# Patient Record
Sex: Female | Born: 1990 | Race: White | Hispanic: No | Smoking: Never smoker
Health system: Southern US, Community
[De-identification: ages and names within clinical notes are randomized; demographics above are authoritative.]

## PROBLEM LIST (undated history)

## (undated) DIAGNOSIS — G35D Multiple sclerosis, unspecified: Secondary | ICD-10-CM

## (undated) HISTORY — PX: WISDOM TOOTH EXTRACTION: SHX21

---

## 2020-08-19 ENCOUNTER — Other Ambulatory Visit (HOSPITAL_COMMUNITY)
Admission: RE | Admit: 2020-08-19 | Discharge: 2020-08-19 | Disposition: A | Discharge status: Home / Self Care | Payer: No Typology Code available for payment source | Source: Ambulatory Visit | Attending: Obstetrics | Admitting: Obstetrics

## 2020-08-19 ENCOUNTER — Ambulatory Visit (INDEPENDENT_AMBULATORY_CARE_PROVIDER_SITE_OTHER): Payer: No Typology Code available for payment source | Admitting: Obstetrics

## 2020-08-19 ENCOUNTER — Encounter: Payer: Self-pay | Admitting: Obstetrics

## 2020-08-19 ENCOUNTER — Other Ambulatory Visit: Payer: Self-pay

## 2020-08-19 VITALS — BP 120/72 | HR 83 | Ht 69.0 in | Wt 214.0 lb

## 2020-08-19 DIAGNOSIS — Z01419 Encounter for gynecological examination (general) (routine) without abnormal findings: Secondary | ICD-10-CM

## 2020-08-19 DIAGNOSIS — Z1151 Encounter for screening for human papillomavirus (HPV): Secondary | ICD-10-CM | POA: Insufficient documentation

## 2020-08-19 DIAGNOSIS — Z113 Encounter for screening for infections with a predominantly sexual mode of transmission: Secondary | ICD-10-CM | POA: Insufficient documentation

## 2020-08-19 DIAGNOSIS — Z23 Encounter for immunization: Secondary | ICD-10-CM | POA: Diagnosis not present

## 2020-08-19 NOTE — Progress Notes (Signed)
Gynecology Annual Exam  PCP: Pcp, No  Chief Complaint:  Chief Complaint  Patient presents with  . Gynecologic Exam    still spotting after period last week; is on bc - depo    History of Present Illness:  Ms. Jacqueline Guerrero is a 29 y.o. G0P0000 who LMP was Patient's last menstrual period was 08/11/2020 (exact date)., presents today for her annual examination.  Her menses are irregular, lasting 3 day(s).  Dysmenorrhea none. She reports irregualr bleeding with Depo have intermenstrual bleeding.  She is single partner, contraception - Depo-Provera injections.  Last Pap: cannot remember- several years ago in [place]  Results were: no abnormalities /neg HPV DNA neg  Hx of STDs: none  There is a FH of breast cancer(her mother at age 60) There is no FH of ovarian cancer. The patient does do self-breast exams.  Tobacco use: She uses smokeless tobacco. Alcohol use: social drinker Exercise: not active    The patient wears seatbelts: yes.   The patient reports that domestic violence in her life is absent.   No past medical history on file.    Prior to Admission medications   Medication Sig Start Date End Date Taking? Authorizing Provider  ELLA 30 MG tablet Take 1 tablet by mouth once. 07/14/20  Yes [provider]  medroxyPROGESTERone (DEPO-SUBQ PROVERA 104) 104 MG/0.65ML injection Inject 104 mg into the skin See admin instructions. Inject every 12 weeks.   Yes [provider]    Allergies  Allergen Reactions  . Penicillins     Gynecologic History: Patient's last menstrual period was 08/11/2020 (exact date). History of abnormal pap smear: No History of STI: No   Obstetric History: G0P0000  Social History   Socioeconomic History  . Marital status: Single    Spouse name: Not on file  . Number of children: Not on file  . Years of education: Not on file  . Highest education level: Not on file  Occupational History  . Not on file  Tobacco Use  . Smoking  status: Never Smoker  . Smokeless tobacco: Never Used  Vaping Use  . Vaping Use: Every day  Substance and Sexual Activity  . Alcohol use: Yes    Comment: 2 times a week  . Drug use: Not Currently  . Sexual activity: Yes    Birth control/protection: Injection  Other Topics Concern  . Not on file  Social History Narrative  . Not on file   Social Determinants of Health   Financial Resource Strain:   . Difficulty of Paying Living Expenses: Not on file  Food Insecurity:   . Worried About Programme researcher, broadcasting/film/video in the Last Year: Not on file  . Ran Out of Food in the Last Year: Not on file  Transportation Needs:   . Lack of Transportation (Medical): Not on file  . Lack of Transportation (Non-Medical): Not on file  Physical Activity:   . Days of Exercise per Week: Not on file  . Minutes of Exercise per Session: Not on file  Stress:   . Feeling of Stress : Not on file  Social Connections:   . Frequency of Communication with Friends and Family: Not on file  . Frequency of Social Gatherings with Friends and Family: Not on file  . Attends Religious Services: Not on file  . Active Member of Clubs or Organizations: Not on file  . Attends Banker Meetings: Not on file  . Marital Status: Not on file  Intimate Partner  Violence:   . Fear of Current or Ex-Partner: Not on file  . Emotionally Abused: Not on file  . Physically Abused: Not on file  . Sexually Abused: Not on file    Family History  Problem Relation Age of Onset  . Breast cancer Mother 47  . Cancer Mother 40       brain, bone between 62 and 65  . Cancer Father 50       skin    Review of Systems  Constitutional: Negative.   HENT: Negative.   Eyes: Negative.   Respiratory:       Smoker-vapes not ready to quit  Cardiovascular: Negative.   Gastrointestinal: Negative.   Genitourinary: Negative.   Skin: Positive for rash.  Neurological: Negative.   Endo/Heme/Allergies: Negative.   Psychiatric/Behavioral:  Negative.        Hx of suicide attempts and inpatient treatment in 2016. No present therapy or medication Denies any depression or anxiety or suicidal ideations.     Physical Exam BP 120/72   Pulse 83   Ht 5\' 9"  (1.753 m)   Wt 214 lb (97.1 kg)   LMP 08/11/2020 (Exact Date)   BMI 31.60 kg/m    Physical Exam Constitutional:      Appearance: Normal appearance. She is obese.  Genitourinary:     Vulva normal.     Genitourinary Comments: Shaves entire mons  HENT:     Head: Normocephalic.     Nose: Nose normal.     Mouth/Throat:     Mouth: Mucous membranes are moist.     Pharynx: Oropharynx is clear.  Eyes:     Pupils: Pupils are equal, round, and reactive to light.  Cardiovascular:     Rate and Rhythm: Normal rate and regular rhythm.     Pulses: Normal pulses.     Heart sounds: Normal heart sounds.  Pulmonary:     Effort: Pulmonary effort is normal.     Breath sounds: Normal breath sounds.  Abdominal:     General: Bowel sounds are normal.     Palpations: Abdomen is soft.  Musculoskeletal:        General: Normal range of motion.     Cervical back: Normal range of motion and neck supple.  Neurological:     General: No focal deficit present.     Mental Status: She is alert and oriented to person, place, and time.  Skin:    General: Skin is warm and dry.  Psychiatric:        Mood and Affect: Mood normal.        Behavior: Behavior normal.        Thought Content: Thought content normal.     Female chaperone present for pelvic and breast  portions of the physical exam  Results:     Assessment: 29 y.o. G0P0000 female here for routine annual gynecologic examination contraception with Depo provera.  Plan: Problem List Items Addressed This Visit    None      Screening: -- Blood pressure screen normal -- Weight screening: obese: discussed management options, including lifestyle, dietary, and exercise. -- Depression screening negative (PHQ-9) -- Nutrition:  normal -- cholesterol screening: not due for screening -- osteoporosis screening: not due -- tobacco screening: using: discussed quitting using the 5 A's -- alcohol screening: AUDIT questionnaire indicates low-risk usage. -- family history of breast cancer screening: done. not at high risk. -- no evidence of domestic violence or intimate partner violence. -- STD screening: gonorrhea/chlamydia NAAT  collected -- pap smear collected per ASCCP guidelines -- flu vaccine received today -- HPV vaccination series: has not received - pt refuses  Contraception education discussed and she is considering an IUD. She will make an appointment for her next Depo or notifiy [place] if she desires to change to an IUD.  Mirna Mires, CNM  08/19/2020 7:01 PM    08/19/2020 1:40 PM

## 2020-08-20 LAB — HEP, RPR, HIV PANEL
HIV Screen 4th Generation wRfx: NONREACTIVE
Hepatitis B Surface Ag: NEGATIVE
RPR Ser Ql: NONREACTIVE

## 2020-08-21 ENCOUNTER — Encounter: Payer: Self-pay | Admitting: Obstetrics

## 2020-08-21 LAB — CYTOLOGY - PAP
Chlamydia: NEGATIVE
Comment: NEGATIVE
Comment: NEGATIVE
Comment: NEGATIVE
Comment: NORMAL
Diagnosis: NEGATIVE
High risk HPV: NEGATIVE
Neisseria Gonorrhea: NEGATIVE
Trichomonas: NEGATIVE

## 2021-05-19 ENCOUNTER — Ambulatory Visit: Payer: Self-pay | Admitting: Podiatry

## 2021-05-19 ENCOUNTER — Other Ambulatory Visit: Payer: Self-pay

## 2021-05-19 ENCOUNTER — Ambulatory Visit: Payer: PRIVATE HEALTH INSURANCE

## 2021-07-02 ENCOUNTER — Other Ambulatory Visit: Payer: Self-pay | Admitting: Podiatry

## 2021-07-02 DIAGNOSIS — S86312A Strain of muscle(s) and tendon(s) of peroneal muscle group at lower leg level, left leg, initial encounter: Secondary | ICD-10-CM

## 2021-07-02 DIAGNOSIS — S8262XA Displaced fracture of lateral malleolus of left fibula, initial encounter for closed fracture: Secondary | ICD-10-CM

## 2021-07-13 ENCOUNTER — Ambulatory Visit
Admission: RE | Admit: 2021-07-13 | Discharge: 2021-07-13 | Disposition: A | Discharge status: Home / Self Care | Payer: PRIVATE HEALTH INSURANCE | Source: Ambulatory Visit | Attending: Podiatry | Admitting: Podiatry

## 2021-07-13 ENCOUNTER — Other Ambulatory Visit: Payer: Self-pay

## 2021-07-13 DIAGNOSIS — S86312A Strain of muscle(s) and tendon(s) of peroneal muscle group at lower leg level, left leg, initial encounter: Secondary | ICD-10-CM | POA: Diagnosis present

## 2021-07-13 DIAGNOSIS — S8262XA Displaced fracture of lateral malleolus of left fibula, initial encounter for closed fracture: Secondary | ICD-10-CM | POA: Insufficient documentation

## 2022-02-04 ENCOUNTER — Ambulatory Visit: Payer: BLUE CROSS/BLUE SHIELD | Admitting: Obstetrics

## 2022-08-17 ENCOUNTER — Encounter: Payer: Self-pay | Admitting: Internal Medicine

## 2022-08-17 DIAGNOSIS — G4719 Other hypersomnia: Secondary | ICD-10-CM

## 2022-08-24 NOTE — Procedures (Signed)
[facility] Report Part I                                                                 Phone: [phone] Fax: [phone]  Patient Name: Jacqueline Guerrero, [name]. Acquisition Number: [id number]  Date of Birth: 08/18/1991 Acquisition Date: 08/17/2022  Referring Physician: Willaim Rayas FNP-C     History: The patient is a 31 year old female who was referred for evaluation of possible sleep apnea with snoring and sleepiness. Medical History: depression, anxiety.  Medications: topiramate, melatonin, magnesium, lactobacillus rhamnosus  Procedure: This routine overnight polysomnogram was performed on the [facility] 5 using the standard diagnostic protocol. This included 6 channels of EEG, 2 channels of EOG, chin EMG, bilateral anterior tibialis EMG, nasal/oral thermistor, PTAF (nasal pressure transducer), chest and abdominal wall movements, EKG, and pulse oximetry.  Description: The total recording time was 417.0 minutes. The total sleep time was 393.5 minutes. There were a total of 6.5 minutes of wakefulness after sleep onset for a goodsleep efficiency of 94.4%. The latency to sleep onset was within normal limitsat 17.0 minutes. The R sleep onset latency was within normal limits at 73.5 minutes. Sleep parameters, as a percentage of the total sleep time, demonstrated 0.9% of sleep was in N1 sleep, 66.5% N2, 17.4% N3 and 15.2% R sleep. There were a total of 32 arousals for an arousal index of 4.9 arousals per hour of sleep that was normal.  Respiratory monitoring demonstrated intermittent mild to moderate degree of snoring in all positions. Only 2 respiratory events were observed during the study. Tthe baseline oxygen saturation during wakefulness was 96%, during NREM sleep averaged 96%, and during REM sleep averaged  96%. The total duration of oxygen < 90% was 0.0 minutes.  Cardiac monitoring- There were no significant cardiac rhythm irregularities.   Periodic limb movement  monitoring- did not demonstrate periodic limb movements.   Impression: This routine overnight polysomnogram did not demonstrate significant obstructive sleep apnea with only 2 respiratory events observed.  Sleep efficiency was good and sleep onset latency was normal. Only a reduced REM percentage was observed.   Recommendations:     Would recommend weight loss in a patient with a BMI of 39.0.    Allyne Gee, MD, [facility] Diplomate ABMS-Pulmonary, Critical Care and Sleep Medicine  Electronically reviewed and digitally signed  [place] Report Part II  Phone: [phone] Fax: [phone]  Patient last name [facility] Neck Size    in. Acquisition [id number]  Patient first name Jacqueline Guerrero. Weight 264.0 lbs. Started 08/17/2022 at 10:51:46 PM  Birth date [birth year] Height 69.0 in. Stopped 08/18/2022 at 5:55:58 AM  Age 68 BMI 39.0 lb/in2 Duration 417.0  Study Type Adult      Juleen Starr - RPSGT, Margaretmary Eddy  Reviewed by: Richelle Ito. Henke, PhD, ABSM, FAASM Sleep Data: Lights Out: 10:56:16 PM Sleep Onset: 11:13:16 PM  Lights On: 5:53:16 AM Sleep Efficiency: 94.4 %  Total Recording Time: 417.0 min Sleep Latency (from Lights Off) 17.0 min  Total Sleep Time (TST): 393.5 min R Latency (from Sleep Onset): 73.5 min  Sleep Period Time: 399.5 min Total number of awakenings: 5  Wake during sleep: 6.0 min Wake After Sleep Onset (WASO):  6.5 min   Sleep Data:         Arousal Summary: Stage  Latency from lights out (min) Latency from sleep onset (min) Duration (min) % Total Sleep Time  Normal values  N 1 17.0 0.0 3.5 0.9 (5%)  N 2 17.5 0.5 261.5 66.5 (50%)  N 3 43.0 26.0 68.5 17.4 (20%)  R 90.5 73.5 60.0 15.2 (25%)    Number Index  Spontaneous 32 4.9  Apneas & Hypopneas 0 0.0  RERAs 0 0.0       (Apneas & Hypopneas & RERAs)  (0) (0.0)  Limb Movement 0 0.0  Snore 0 0.0  TOTAL 32 4.9     Respiratory Data:  CA OA MA Apnea Hypopnea* A+ H RERA Total  Number 0 0 0 0 2  2 0 2  Mean Dur (sec) 0.0 0.0 0.0 0.0 33.0 33.0 0.0 33.0  Max Dur (sec) 0.0 0.0 0.0 0.0 41.0 41.0 0.0 41.0  Total Dur (min) 0.0 0.0 0.0 0.0 1.1 1.1 0.0 1.1  % of TST 0.0 0.0 0.0 0.0 0.3 0.3 0.0 0.3  Index (#/h TST) 0.0 0.0 0.0 0.0 0.3 0.3 0.0 0.3  *Hypopneas scored based on 4% or greater desaturation.  Sleep Stage:        REM NREM TST  AHI 1.0 0.0 0.3  RDI 1.0 0.0 0.3           Body Position Data:  Sleep (min) TST (%) REM (min) NREM (min) CA (#) OA (#) MA (#) HYP (#) AHI (#/h) RERA (#) RDI (#/h) Desat (#)  Supine 238.8 60.69 44.3 194.5 0 0 0 2 0.5 0 0.5 9  Non-Supine 154.70 39.31 15.70 139.00 0.00 0.00 0.00 0.00 0.00 0 0.00 3.00  Left: 154.5 39.26 15.5 139.0 0 0 0 0 0.0 0 0.00 3  Right: 0.2 0.05 0.2 0.0 0 0 0 0 0.0 0 0.00 0     Snoring: Total number of snoring episodes  0  Total time with snoring    min (   % of sleep)   Oximetry Distribution:             WK REM NREM TOTAL  Average (%)   96 96 96 96  < 90% 0.0 0.0 0.0 0.0  < 80% 0.0 0.0 0.0 0.0  < 70% 0.0 0.0 0.0 0.0  # of Desaturations* 1 6 5 12   Desat Index (#/hour) 2.6 6.0 0.9 1.8  Desat Max (%) 4 5 4 5   Desat Max Dur (sec) 37.0 115.0 25.0 115.0  Approx Min O2 during sleep 93  Approx min O2 during a respiratory event 94  Was Oxygen added (Y/N) and final rate No:   0 LPM  *Desaturations based on 3% or greater drop from baseline.   Cheyne Stokes Breathing: None Present   Hypoventilation: None Present    Heart Rate Summary:  Average Heart Rate During Sleep 53.8 bpm      Highest Heart Rate During Sleep (95th %) 65.0 bpm      Highest Heart Rate During Sleep 136 bpm      Highest Heart Rate During Recording (TIB) 162 bpm (artifact)   Heart Rate Observations: Event Type # Events   Bradycardia 0 Lowest HR Scored: N/A  Sinus Tachycardia During Sleep 0 Highest HR Scored: N/A  Narrow Complex Tachycardia 0 Highest HR Scored: N/A  Wide Complex Tachycardia 0 Highest HR Scored: N/A  Asystole 0 Longest  Pause: N/A  Atrial Fibrillation 0 Duration Longest  Event: N/A  Other Arrythmias  No Type:    Periodic Limb Movement Data: (Primary legs unless otherwise noted) Total # Limb Movement 0 Limb Movement Index 0.0  Total # PLMS    PLMS Index     Total # PLMS Arousals    PLMS Arousal Index     Percentage Sleep Time with PLMS   min (   % sleep)  Mean Duration limb movements (secs)

## 2023-03-02 IMAGING — MR MR ANKLE*L* W/O CM
5 series · 40 of 40 positions shown · non-contrast
Comparison: Joint fluid collecting in the posterior recess of the
posterior subtalar joint.

CLINICAL DATA: Work injury, left ankle pain

EXAM:
MRI OF THE LEFT ANKLE WITHOUT CONTRAST
TECHNIQUE: Multiplanar, multisequence MR imaging of the ankle was performed. No
intravenous contrast was administered.

[Series 3: PD fat-sat · axial · left · 3.0mm · 0.50mm/px · z∈[-61,+79]mm · 10 of 36 slices shown]
[im 1/36]
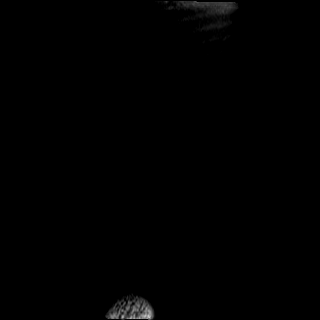
[im 4/36]
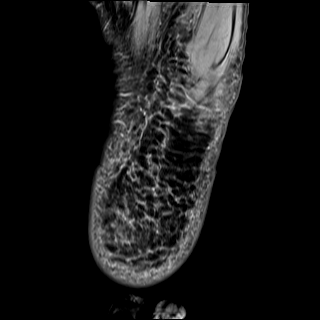
[im 8/36]
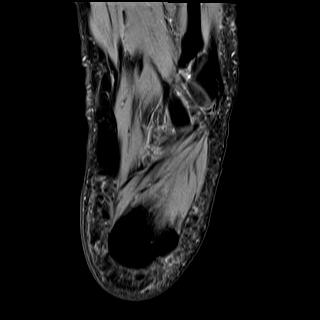
[im 12/36]
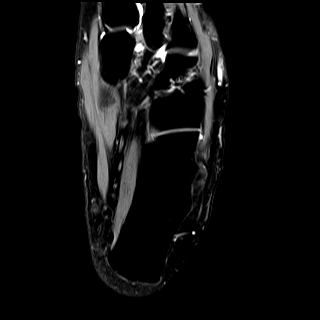
[im 16/36]
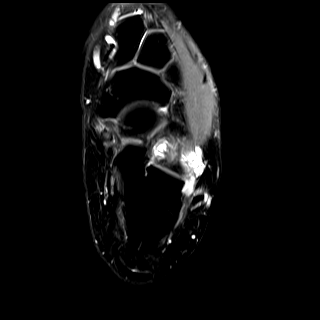
[im 20/36]
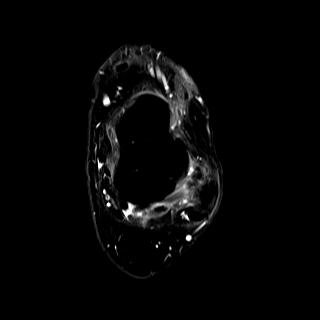
[im 24/36]
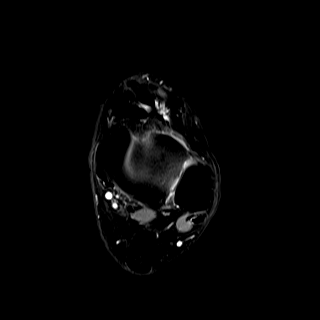
[im 28/36]
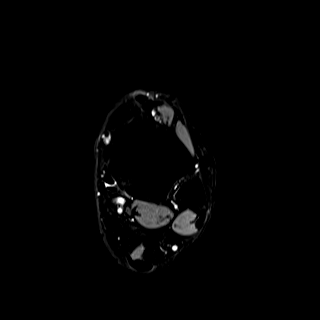
[im 32/36]
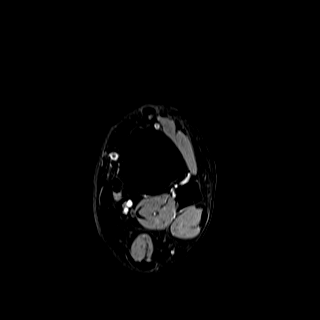
[im 36/36]
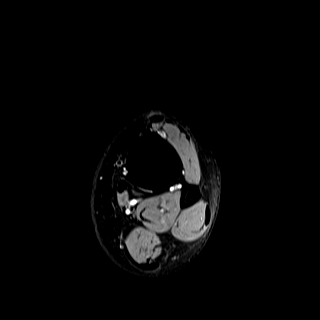

[Series 4: T2 fat-sat · axial · left · 3.0mm · 0.50mm/px · z∈[-61,+79]mm · 10 of 36 slices shown]
[im 1/36]
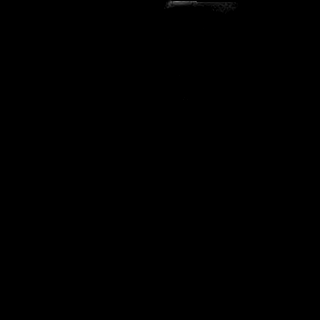
[im 4/36]
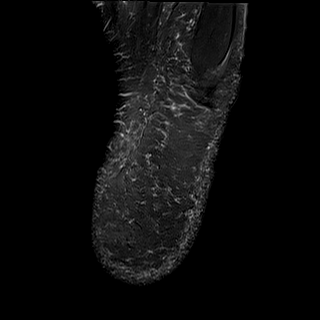
[im 8/36]
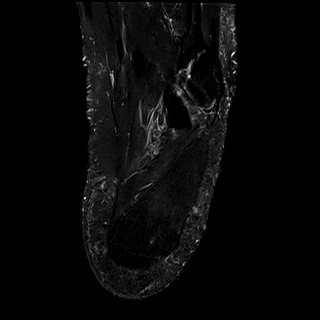
[im 12/36]
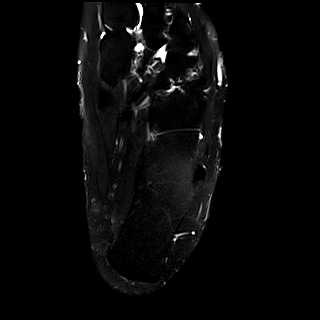
[im 16/36]
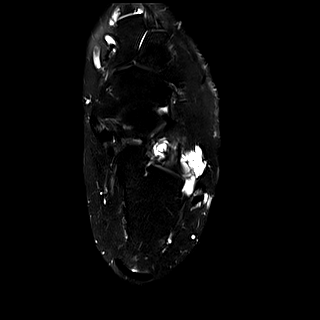
[im 20/36]
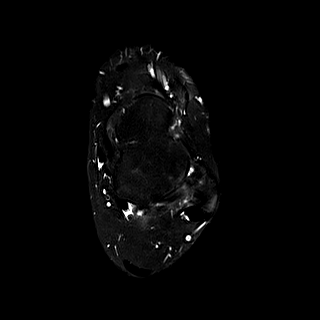
[im 24/36]
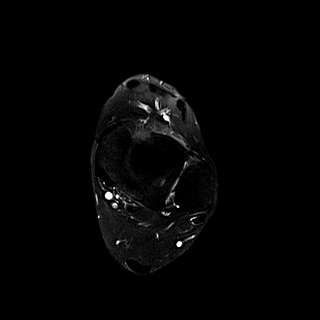
[im 28/36]
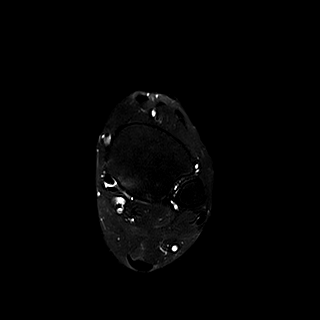
[im 32/36]
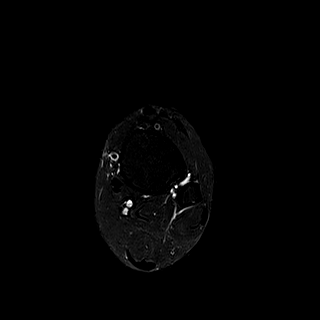
[im 36/36]
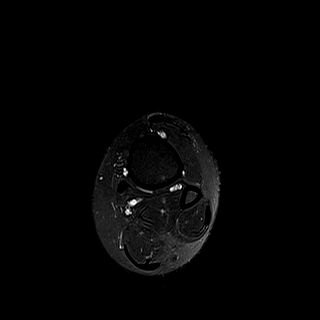

[Series 6: T2 · coronal · left · 3.0mm · 0.62mm/px · 10 of 40 slices shown]
[im 1/40]
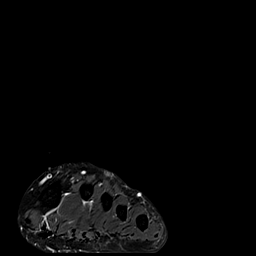
[im 5/40]
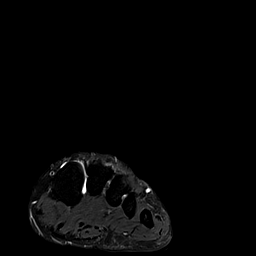
[im 9/40]
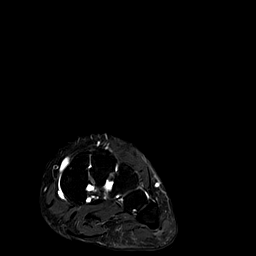
[im 14/40]
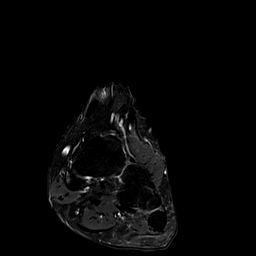
[im 18/40]
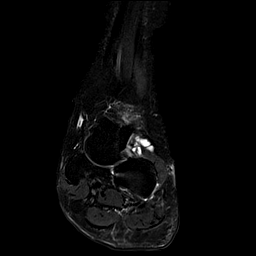
[im 22/40]
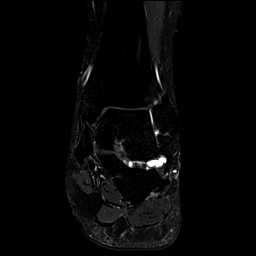
[im 27/40]
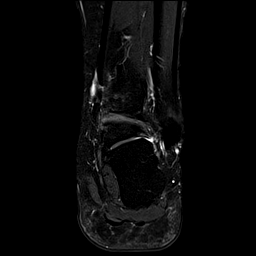
[im 31/40]
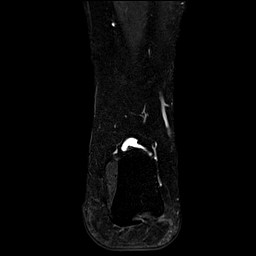
[im 35/40]
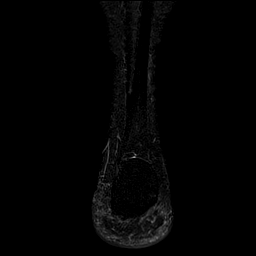
[im 40/40]
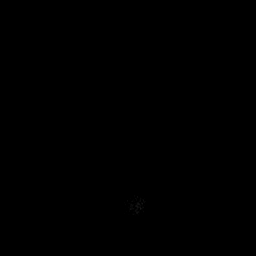

[Series 7: T1 · sagittal · left · 4.0mm · 0.70mm/px · 5 of 21 slices shown]
[im 1/21]
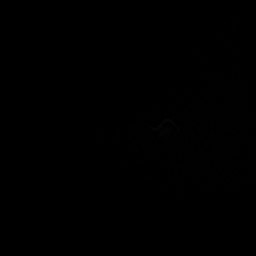
[im 6/21]
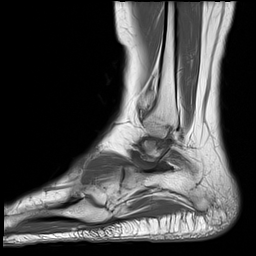
[im 11/21]
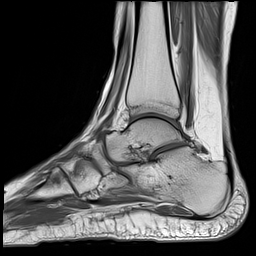
[im 16/21]
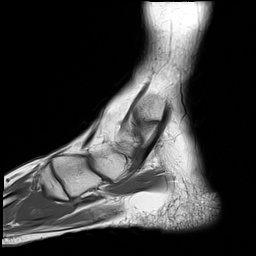
[im 21/21]
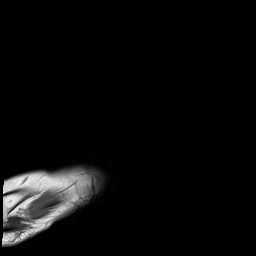

[Series 8: STIR · sagittal · left · 4.0mm · 0.35mm/px · 5 of 21 slices shown]
[im 1/21]
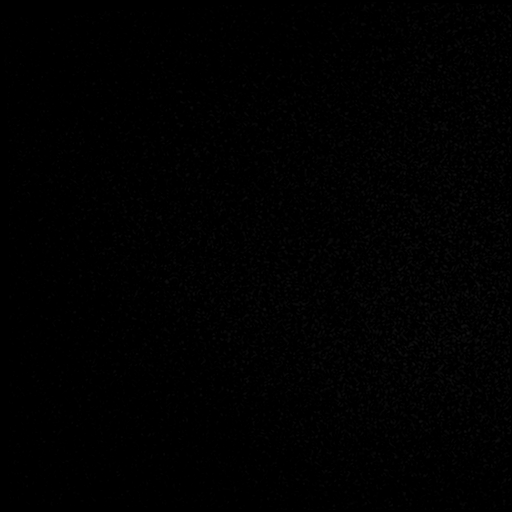
[im 6/21]
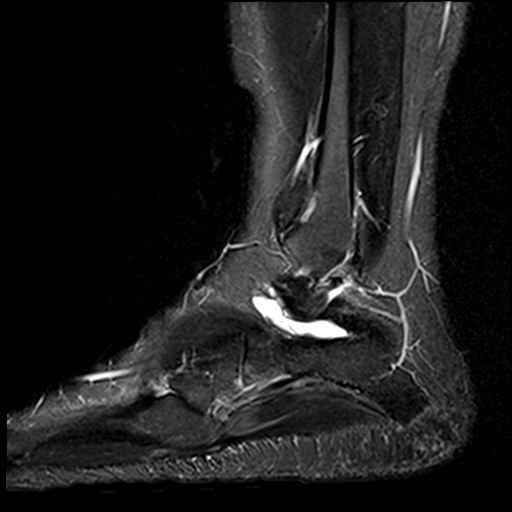
[im 11/21]
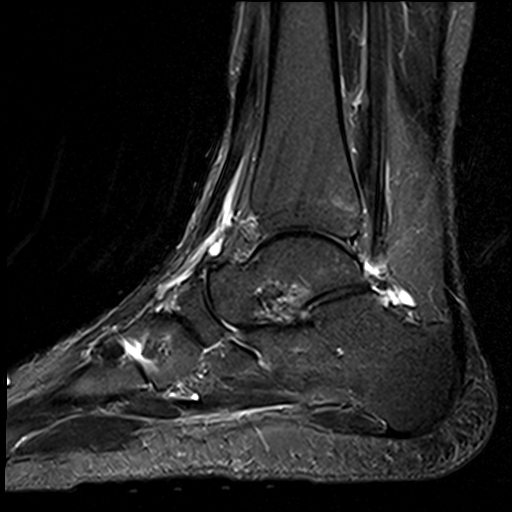
[im 16/21]
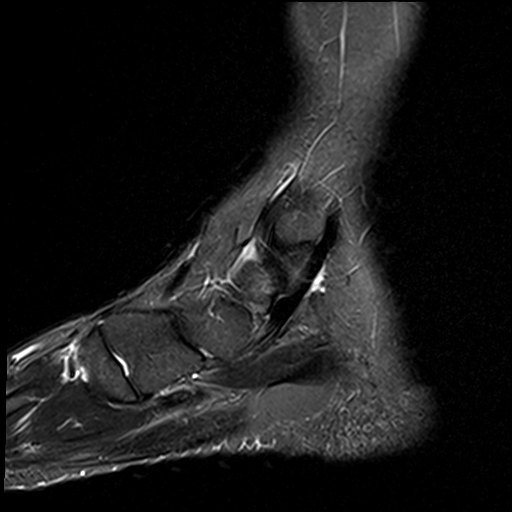
[im 21/21]
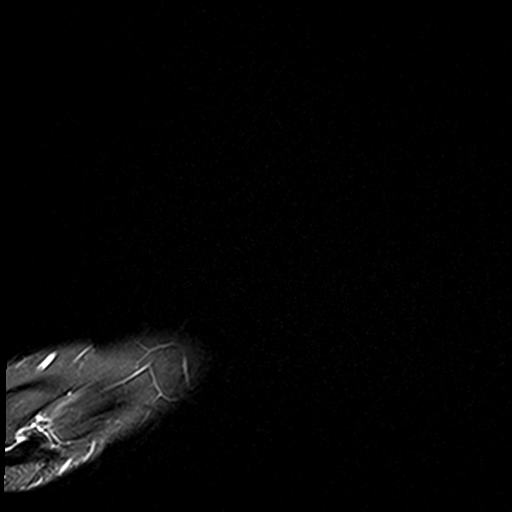

[40 of 40 positions shown; findings below may reference images not displayed]

FINDINGS: TENDONS

Peroneal: There is a split peroneus brevis tear at and just below
the lateral malleolus, with continuity distally. Tear length is
approximately 1.4 cm. The peroneal longus tendon is intact.

Posteromedial: Posterior tibial tendon intact. Flexor digitorum
longus tendon intact. Flexor hallucis longus tendon intact.

Anterior: Tibialis anterior tendon intact. Extensor hallucis longus
tendon intact Extensor digitorum longus tendon intact.

Achilles:  Intact.

Plantar Fascia: Intact.

LIGAMENTS

Lateral: Anterior talofibular ligament intact. Calcaneofibular
ligament intact. Posterior talofibular ligament intact. Anterior and
posterior tibiofibular ligaments intact.

Medial: Deltoid ligament intact. Spring ligament intact.

CARTILAGE

Ankle Joint: No joint effusion. Normal ankle mortise. No chondral
defect.

Subtalar Joints/Sinus Tarsi: Ganglion formation along the lateral
hindfoot, measuring 3.6 x 0.8 x 2.8 cm (sagittal stir image 16,
coronal T2 image 21).

Bones: Well-corticated ossification with no associated edema signal
along the tip of the lateral malleolus, compatible with old injury.
No acute fracture or dislocation.

Soft Tissue: No fluid collection or hematoma. Muscles are normal
without edema or atrophy. Tarsal tunnel is normal.
IMPRESSION: Split peroneus brevis tear at and just below the lateral malleolus,
with continuity distally to its attachment on the base of the fifth
metatarsal.

Intact lateral ankle ligaments.

Ganglion formation along the lateral hindfoot, overall measuring
x 0.8 x 2.8 cm.

Well corticated ossification near the tip of the lateral malleolus,
compatible with old injury or accessory ossicle. No acute fracture.

## 2023-11-01 ENCOUNTER — Other Ambulatory Visit: Payer: Self-pay

## 2023-11-01 ENCOUNTER — Emergency Department: Payer: BLUE CROSS/BLUE SHIELD

## 2023-11-01 ENCOUNTER — Encounter: Payer: Self-pay | Admitting: Emergency Medicine

## 2023-11-01 ENCOUNTER — Emergency Department
Admission: EM | Admit: 2023-11-01 | Discharge: 2023-11-01 | Disposition: A | Discharge status: Home / Self Care | Payer: BLUE CROSS/BLUE SHIELD | Attending: Emergency Medicine | Admitting: Emergency Medicine

## 2023-11-01 DIAGNOSIS — R200 Anesthesia of skin: Secondary | ICD-10-CM | POA: Diagnosis present

## 2023-11-01 DIAGNOSIS — R531 Weakness: Secondary | ICD-10-CM | POA: Insufficient documentation

## 2023-11-01 LAB — COMPREHENSIVE METABOLIC PANEL
ALT: 16 U/L (ref 0–44)
AST: 16 U/L (ref 15–41)
Albumin: 4.4 g/dL (ref 3.5–5.0)
Alkaline Phosphatase: 55 U/L (ref 38–126)
Anion gap: 12 (ref 5–15)
BUN: 12 mg/dL (ref 6–20)
CO2: 23 mmol/L (ref 22–32)
Calcium: 9.6 mg/dL (ref 8.9–10.3)
Chloride: 101 mmol/L (ref 98–111)
Creatinine, Ser: 0.93 mg/dL (ref 0.44–1.00)
GFR, Estimated: >60 mL/min (ref >60)
Glucose, Bld: 100 mg/dL — ABNORMAL HIGH (ref 70–99)
Potassium: 4 mmol/L (ref 3.5–5.1)
Sodium: 136 mmol/L (ref 135–145)
Total Bilirubin: 0.7 mg/dL (ref 0.0–1.2)
Total Protein: 7.3 g/dL (ref 6.5–8.1)

## 2023-11-01 LAB — DIFFERENTIAL
Abs Immature Granulocytes: 0.04 10*3/uL (ref 0.00–0.07)
Basophils Absolute: 0 10*3/uL (ref 0.0–0.1)
Basophils Relative: 0 %
Eosinophils Absolute: 0.3 10*3/uL (ref 0.0–0.5)
Eosinophils Relative: 2 %
Immature Granulocytes: 0 %
Lymphocytes Relative: 23 %
Lymphs Abs: 2.4 10*3/uL (ref 0.7–4.0)
Monocytes Absolute: 0.6 10*3/uL (ref 0.1–1.0)
Monocytes Relative: 5 %
Neutro Abs: 7.4 10*3/uL (ref 1.7–7.7)
Neutrophils Relative %: 70 %

## 2023-11-01 LAB — ETHANOL: Alcohol, Ethyl (B): <10 mg/dL (ref <10)

## 2023-11-01 LAB — CBC
HCT: 40.5 % (ref 36.0–46.0)
Hemoglobin: 14.1 g/dL (ref 12.0–15.0)
MCH: 31 pg (ref 26.0–34.0)
MCHC: 34.8 g/dL (ref 30.0–36.0)
MCV: 89 fL (ref 80.0–100.0)
Platelets: 257 10*3/uL (ref 150–400)
RBC: 4.55 MIL/uL (ref 3.87–5.11)
RDW: 12.4 % (ref 11.5–15.5)
WBC: 10.8 10*3/uL — ABNORMAL HIGH (ref 4.0–10.5)
nRBC: 0 % (ref 0.0–0.2)

## 2023-11-01 LAB — APTT: aPTT: 29 s (ref 24–36)

## 2023-11-01 LAB — PROTIME-INR
INR: 1 (ref 0.8–1.2)
Prothrombin Time: 13.1 s (ref 11.4–15.2)

## 2023-11-01 LAB — POC URINE PREG, ED: Preg Test, Ur: NEGATIVE

## 2023-11-01 MED ORDER — GADOBUTROL 1 MMOL/ML IV SOLN
10.0000 mL | Freq: Once | INTRAVENOUS | Status: AC | PRN
  Administered 2023-11-01: 10 mL via INTRAVENOUS

## 2023-11-01 MED ORDER — SODIUM CHLORIDE 0.9% FLUSH: 3.0000 mL | Freq: Once | INTRAVENOUS | Status: DC

## 2023-11-01 NOTE — ED Provider Triage Note (Signed)
 Emergency Medicine Provider Triage Evaluation Note  Shirlie Enck , a 32 y.o. female  was evaluated in triage.  Pt complains of tingling in left hand, some shaking when trying to hold items, no known injury, right handed.  Review of Systems  Positive:  Negative:   Physical Exam  BP (!) 140/88 (BP Location: Left Arm)   Pulse 71   Temp 97.9 F (36.6 C) (Oral)   Resp 18   Ht 5' 9 (1.753 m)   Wt 115.7 kg   SpO2 100%   BMI 37.66 kg/m  Gen:   Awake, no distress   Resp:  Normal effort  MSK:   Moves extremities without difficulty  Other:    Medical Decision Making  Medically screening exam initiated at 4:27 PM.  Appropriate orders placed.  Chasmine Lender was informed that the remainder of the evaluation will be completed by another provider, this initial triage assessment does not replace that evaluation, and the importance of remaining in the ED until their evaluation is complete.     Gasper Devere ORN, PA-C 11/01/23 1627

## 2023-11-01 NOTE — ED Provider Notes (Signed)
 [facility] Provider Note    Event Date/Time   First MD Initiated Contact with Patient 11/01/23 2247     (approximate)   History   Numbness (/)   HPI  Jacqueline Guerrero is a 32 y.o. female with no significant past medical history presents emergency department with numbness in the left fingertips and weakness of the left hand.  Symptoms started on Friday.  No other weakness or numbness or tingling.  No difficulty with speech.  No headache.      Physical Exam   Triage Vital Signs: ED Triage Vitals  Encounter Vitals Group     BP 11/01/23 1619 (!) 140/88     Systolic BP Percentile --      Diastolic BP Percentile --      Pulse Rate 11/01/23 1619 71     Resp 11/01/23 1619 18     Temp 11/01/23 1619 97.9 F (36.6 C)     Temp Source 11/01/23 1619 Oral     SpO2 11/01/23 1619 100 %     Weight 11/01/23 1619 255 lb (115.7 kg)     Height 11/01/23 1619 5' 9 (1.753 m)     Head Circumference --      Peak Flow --      Pain Score 11/01/23 1628 0     Pain Loc --      Pain Education --      Exclude from Growth Chart --     Most recent vital signs: Vitals:   11/01/23 1619 11/01/23 2240  BP: (!) 140/88 (!) 137/91  Pulse: 71 64  Resp: 18 19  Temp: 97.9 F (36.6 C) 98.2 F (36.8 C)  SpO2: 100% 98%     General: Awake, no distress.   CV:  Good peripheral perfusion. regular rate and  rhythm Resp:  Normal effort.  Abd:  No distention.   Other:  Left hand nontender, full range of motion, neurovascular intact   ED Results / Procedures / Treatments   Labs (all labs ordered are listed, but only abnormal results are displayed) Labs Reviewed  CBC - Abnormal; Notable for the following components:      Result Value   WBC 10.8 (*)    All other components within normal limits  COMPREHENSIVE METABOLIC PANEL - Abnormal; Notable for the following components:   Glucose, Bld 100 (*)    All other components within normal limits  PROTIME-INR  APTT  DIFFERENTIAL   ETHANOL  POC URINE PREG, ED     EKG     RADIOLOGY CT of the head ordered from triage    PROCEDURES:   Procedures   MEDICATIONS ORDERED IN ED: Medications  gadobutrol  (GADAVIST ) 1 MMOL/ML injection 10 mL (10 mLs Intravenous Contrast Given 11/01/23 2238)     IMPRESSION / MDM / ASSESSMENT AND PLAN / ED COURSE  I reviewed the triage vital signs and the nursing notes.                              Differential diagnosis includes, but is not limited to, cervical radiculopathy, CVA, SAH, carpal tunnel  Patient's presentation is most consistent with acute illness / injury with system symptoms.   CT of the cervical spine independently reviewed interpreted by me as being negative for any acute abnormality does not show any nerve impingement  CT of the head, did independently review and interpret radiologist reading, has concerns for patchy opacities  which she thinks could be a demyelinating disease.  Would like for us  to get MRI  I did explain to the patient that she needs MRI, had nursing staff insert IV, labs are reassuring, after the MRI patient is asking to leave, I did advise her that would be best to stay and wait for her results, patient states she does not see how that would change her treatment at this time and she can follow-up with her PCP.  She does appear to be stable.  Discharged in stable condition.  Although did instruct her to return if worsening      FINAL CLINICAL IMPRESSION(S) / ED DIAGNOSES   Final diagnoses:  Numbness     Rx / DC Orders   ED Discharge Orders     None        Note:  This document was prepared using Dragon voice recognition software and may include unintentional dictation errors.    Gasper Devere ORN, PA-C 11/01/23 2253    Arlander Charleston, MD 11/01/23 [phone]

## 2023-11-01 NOTE — ED Triage Notes (Signed)
Patient to ED via POV for numbness on the left finger tips and weakness in the left hand. Ongoing since Friday. Denies any other weakness or numbness. No speech difficulties.

## 2023-11-01 NOTE — ED Notes (Signed)
 Patient transported to MRI

## 2024-01-26 ENCOUNTER — Other Ambulatory Visit: Payer: Self-pay

## 2024-01-26 ENCOUNTER — Emergency Department

## 2024-01-26 ENCOUNTER — Emergency Department
Admission: EM | Admit: 2024-01-26 | Discharge: 2024-01-26 | Disposition: A | Discharge status: Home / Self Care | Attending: Emergency Medicine | Admitting: Emergency Medicine

## 2024-01-26 DIAGNOSIS — T8332XA Displacement of intrauterine contraceptive device, initial encounter: Secondary | ICD-10-CM | POA: Diagnosis not present

## 2024-01-26 DIAGNOSIS — R1031 Right lower quadrant pain: Secondary | ICD-10-CM | POA: Diagnosis present

## 2024-01-26 DIAGNOSIS — Y828 Other medical devices associated with adverse incidents: Secondary | ICD-10-CM | POA: Insufficient documentation

## 2024-01-26 DIAGNOSIS — R109 Unspecified abdominal pain: Secondary | ICD-10-CM

## 2024-01-26 LAB — COMPREHENSIVE METABOLIC PANEL WITH GFR
ALT: 15 U/L (ref 0–44)
AST: 16 U/L (ref 15–41)
Albumin: 4.5 g/dL (ref 3.5–5.0)
Alkaline Phosphatase: 56 U/L (ref 38–126)
Anion gap: 8 (ref 5–15)
BUN: 13 mg/dL (ref 6–20)
CO2: 20 mmol/L — ABNORMAL LOW (ref 22–32)
Calcium: 9.4 mg/dL (ref 8.9–10.3)
Chloride: 107 mmol/L (ref 98–111)
Creatinine, Ser: 0.98 mg/dL (ref 0.44–1.00)
GFR, Estimated: >60 mL/min (ref >60)
Glucose, Bld: 144 mg/dL — ABNORMAL HIGH (ref 70–99)
Potassium: 3.4 mmol/L — ABNORMAL LOW (ref 3.5–5.1)
Sodium: 135 mmol/L (ref 135–145)
Total Bilirubin: 0.7 mg/dL (ref 0.0–1.2)
Total Protein: 7.4 g/dL (ref 6.5–8.1)

## 2024-01-26 LAB — URINALYSIS, ROUTINE W REFLEX MICROSCOPIC
Bilirubin Urine: NEGATIVE
Glucose, UA: NEGATIVE mg/dL
Ketones, ur: NEGATIVE mg/dL
Leukocytes,Ua: NEGATIVE
Nitrite: NEGATIVE
Protein, ur: NEGATIVE mg/dL
Specific Gravity, Urine: 1.008 (ref 1.005–1.030)
pH: 5 (ref 5.0–8.0)

## 2024-01-26 LAB — LIPASE, BLOOD: Lipase: 33 U/L (ref 11–51)

## 2024-01-26 LAB — CBC
HCT: 41.8 % (ref 36.0–46.0)
Hemoglobin: 14.8 g/dL (ref 12.0–15.0)
MCH: 31.2 pg (ref 26.0–34.0)
MCHC: 35.4 g/dL (ref 30.0–36.0)
MCV: 88.2 fL (ref 80.0–100.0)
Platelets: 259 10*3/uL (ref 150–400)
RBC: 4.74 MIL/uL (ref 3.87–5.11)
RDW: 12.8 % (ref 11.5–15.5)
WBC: 13.3 10*3/uL — ABNORMAL HIGH (ref 4.0–10.5)
nRBC: 0 % (ref 0.0–0.2)

## 2024-01-26 LAB — POC URINE PREG, ED: Preg Test, Ur: NEGATIVE

## 2024-01-26 MED ORDER — ONDANSETRON HCL 4 MG/2ML IJ SOLN
4.0000 mg | INTRAMUSCULAR | Status: AC
  Administered 2024-01-26: 4 mg via INTRAVENOUS
  Filled 2024-01-26: qty 2

## 2024-01-26 MED ORDER — MORPHINE SULFATE (PF) 4 MG/ML IV SOLN
4.0000 mg | Freq: Once | INTRAVENOUS | Status: AC
  Administered 2024-01-26: 4 mg via INTRAVENOUS
  Filled 2024-01-26: qty 1

## 2024-01-26 MED ORDER — IOHEXOL 300 MG/ML  SOLN
100.0000 mL | Freq: Once | INTRAMUSCULAR | Status: AC | PRN
  Administered 2024-01-26: 100 mL via INTRAVENOUS

## 2024-01-26 MED ORDER — ONDANSETRON 4 MG PO TBDP: 4.0000 mg | ORAL_TABLET | Freq: Once | ORAL | Status: DC

## 2024-01-26 NOTE — ED Provider Notes (Signed)
 [facility] Provider Note    Event Date/Time   First MD Initiated Contact with Patient 01/26/24 0246     (approximate)   History   Abdominal Pain   HPI Jacqueline Guerrero is a 33 y.o. female who presents for evaluation of acute onset right lower quadrant pain that awoke her from sleep.  She had a normal day yesterday and this was very much surprised.  It woke her up from sleep and it caused her to vomit a couple of times.  The pain was sharp and severe.  It almost completely went away after getting morphine here in the emergency department.  She has not had any hematuria or dysuria.  No prior history of kidney stones.  No recent fever, chest pain, shortness of breath.  Her last menstrual cycle was about 2 weeks ago and she has an IUD in place.  She has not ever had an appendectomy.     Physical Exam   Triage Vital Signs: ED Triage Vitals  Encounter Vitals Group     BP 01/26/24 0101 117/63     Systolic BP Percentile --      Diastolic BP Percentile --      Pulse Rate 01/26/24 0101 60     Resp 01/26/24 0101 20     Temp 01/26/24 0101 97.6 F (36.4 C)     Temp Source 01/26/24 0101 Oral     SpO2 01/26/24 0101 100 %     Weight 01/26/24 0100 113.4 kg (250 lb)     Height --      Head Circumference --      Peak Flow --      Pain Score 01/26/24 0100 10     Pain Loc --      Pain Education --      Exclude from Growth Chart --     Most recent vital signs: Vitals:   01/26/24 0400 01/26/24 0551  BP: (!) 100/55 107/74  Pulse: (!) 55 66  Resp: 18 18  Temp:  98.1 F (36.7 C)  SpO2: 96% 98%    General: Awake, no distress after morphine. CV:  Good peripheral perfusion.  Regular rate and rhythm. Resp:  Normal effort. Speaking easily and comfortably, no accessory muscle usage nor intercostal retractions.   Abd:  No distention.  Mild tenderness to palpation of the right lower quadrant with some guarding, no rebound, not peritoneal.  Some mild tenderness is to the  right upper quadrant as well but negative Murphy sign.   ED Results / Procedures / Treatments   Labs (all labs ordered are listed, but only abnormal results are displayed) Labs Reviewed  COMPREHENSIVE METABOLIC PANEL - Abnormal; Notable for the following components:      Result Value   Potassium 3.4 (*)    CO2 20 (*)    Glucose, Bld 144 (*)    All other components within normal limits  CBC - Abnormal; Notable for the following components:   WBC 13.3 (*)    All other components within normal limits  URINALYSIS, ROUTINE W REFLEX MICROSCOPIC - Abnormal; Notable for the following components:   Color, Urine YELLOW (*)    APPearance CLOUDY (*)    Hgb urine dipstick MODERATE (*)    Bacteria, UA FEW (*)    All other components within normal limits  POC URINE PREG, ED - Normal  LIPASE, BLOOD      RADIOLOGY See hospital course for details regarding CT abdomen/pelvis.  PROCEDURES:  Critical Care performed: No  Procedures    IMPRESSION / MDM / ASSESSMENT AND PLAN / ED COURSE  I reviewed the triage vital signs and the nursing notes.                              Differential diagnosis includes, but is not limited to, renal/ureteral colic, UTI/pyelonephritis, appendicitis, biliary colic, ovarian torsion/cyst.  Patient's presentation is most consistent with acute presentation with potential threat to life or bodily function.  Labs/studies ordered: CMP, CBC, urinalysis, lipase, urine pregnancy test, CT abdomen/pelvis  Interventions/Medications given:  Medications  morphine (PF) 4 MG/ML injection 4 mg (4 mg Intravenous Given 01/26/24 0124)  ondansetron (ZOFRAN) injection 4 mg (4 mg Intravenous Given 01/26/24 0124)  iohexol (OMNIPAQUE) 300 MG/ML solution 100 mL (100 mLs Intravenous Contrast Given 01/26/24 0350)    (Note:  hospital course my include additional interventions and/or labs/studies not listed above.)   Based on HPI and current presentation and response to  morphine, I strongly suspect ureteral colic.  She also has moderate hemoglobinuria.  However the patient has no prior history of renal stones and has leukocytosis 13.3, essentially normal comprehensive metabolic panel.  Her pain is well-controlled now.  I will evaluate with a CT of the abdomen and pelvis with IV contrast to rule out appendicitis and to further evaluate for the possibility of a ureteral stone.  She agrees with the plan.   Clinical Course as of 01/26/24 0606  Thu Jan 26, 2024  0557 CT ABDOMEN PELVIS W CONTRAST I viewed and interpreted the patient's CT scan and I do not see a ureteral stone.  Radiology also did not comment on a stone or obstructive process.  However, they commented on the fact that the patient's IUD is rotated and seems to be penetrating the myometrium.  However, this seems unlikely to explain the acute pain the patient was experiencing on the opposite side of her body (the myometrium is being penetrated on the left).  I consulted by phone with Margaretmary Eddy, CNM.  I explained the situation and she agreed that it seems unlikely that the malpositioned IUD is the cause of the patient's acute pain tonight.  She said that removing an IUD can be done as an outpatient, even in the circumstance when it is penetrating the myometrium, and that it is not an emergent condition tonight. [CF]  0559 I reassessed the patient who was sleeping comfortably.  She said her pain is still completely gone.  I reassessed her abdomen with palpation and she has no tenderness to palpation at this time.  I explained that I think that most likely she had a ureteral stone that she passed.  She said that after she thought about it, she remembers having some milder pain in the right flank and right side of the abdomen over the last couple of days.  I suspect she had a stone that started migrating and then made a big move tonight which caused the acute pain that brought her to the ED, but then she was able  to urinate it out.  This is consistent with her hemoglobinuria as well.  Patient is comfortable with the plan for discharge and outpatient follow-up with her GYN at [facility].  No indication for medication at this time.  I gave my usual and customary follow-up recommendations and return precautions. [CF]    Clinical Course User Index [CF] Loleta Rose, MD  FINAL CLINICAL IMPRESSION(S) / ED DIAGNOSES   Final diagnoses:  Right sided abdominal pain  Malpositioned IUD, initial encounter     Rx / DC Orders   ED Discharge Orders     None        Note:  This document was prepared using Dragon voice recognition software and may include unintentional dictation errors.   Loleta Rose, MD 01/26/24 [id number]

## 2024-01-26 NOTE — ED Triage Notes (Addendum)
 Pt arrives with c/o RLQ ABD pain that started a few hours ago. Pt endorses n/v. Pt denies fevers or urinary symptoms. Pt took ibuprofen around 12:30.

## 2024-01-26 NOTE — Discharge Instructions (Addendum)
 As we discussed, we believe that you may have had a kidney stone that was the cause of the pain that brought you in but which you have since passed.  The rest of your evaluation is very reassuring and there is no indication of a persistent stone, appendicitis, or other acute cause of your pain.  However, the CT scan we obtained showed that your IUD is malpositioned and is poking into the inside of your uterus.  We recommend you call your OB/GYN provider at the next available opportunity (preferably this morning) and explain that you went to the emergency department and had a CT scan that showed the IUD is in the wrong place and is "penetrating the myometrium".  Although this is not an emergent issue, it should be addressed in your GYN clinic at the next available opportunity.  Take over-the-counter ibuprofen and/or Tylenol as needed for any residual pain you may have.  Return to the nearest emergency department if you develop new or worsening symptoms that concern you.

## 2024-11-03 ENCOUNTER — Ambulatory Visit
Admission: EM | Admit: 2024-11-03 | Discharge: 2024-11-03 | Disposition: A | Discharge status: Home / Self Care | Attending: Emergency Medicine | Admitting: Emergency Medicine

## 2024-11-03 ENCOUNTER — Encounter: Payer: Self-pay | Admitting: Emergency Medicine

## 2024-11-03 DIAGNOSIS — J029 Acute pharyngitis, unspecified: Secondary | ICD-10-CM | POA: Insufficient documentation

## 2024-11-03 LAB — POCT RAPID STREP A (OFFICE): Rapid Strep A Screen: NEGATIVE

## 2024-11-03 MED ORDER — CEFDINIR 300 MG PO CAPS: 300.0000 mg | ORAL_CAPSULE | Freq: Two times a day (BID) | ORAL | 0 refills | Status: DC

## 2024-11-03 MED ORDER — ONDANSETRON HCL 4 MG PO TABS: 4.0000 mg | ORAL_TABLET | Freq: Three times a day (TID) | ORAL | Status: AC | PRN

## 2024-11-03 NOTE — ED Triage Notes (Signed)
 Patient reports 1 episode vomiting small in amount this morning.  Patient also complains of sore throat, redness and white patches on throat and fatigue x 10 days. Patient has taken Delsym cough  syrup and cough drops with mild relief. Rates pain 3/10. Patient seen at [facility] on 12- 10 -25 and had Covid /flu and strep test done and results was negative and cultures was negative. Went back on 12/13-25 and Mono and results was negative and was told she had a bad viral infection and was given antibiotics. Patient states she got better and symptoms returned yesterday.

## 2024-11-03 NOTE — ED Provider Notes (Signed)
 " [facility]    CSN: [id number] Arrival date & time: 11/03/24  9095      History   Chief Complaint Chief Complaint  Patient presents with   Sore Throat   Emesis   Fatigue    HPI Jacqueline Guerrero is a 34 y.o. female.   Patient presents for evaluation of sore throat, redness and Aldon Hengst patches to the throat, vomiting and fatigue beginning 1 day ago.  Denies presence of fever, cough or congestion.  No known sick contacts.  [facility]10/[facility]24 for same symptoms, COVID flu and strep testing done, negative and culturing negative, was reevaluated on 10/13/2024 and mono testing completed, negative at that time prescribed azithromycin with improvement of symptoms..      History reviewed. No pertinent past medical history.  There are no active problems to display for this patient.   Past Surgical History:  Procedure Laterality Date   WISDOM TOOTH EXTRACTION     two removed 2019    OB History     Gravida  0   Para  0   Term  0   Preterm  0   AB  0   Living  0      SAB  0   IAB  0   Ectopic  0   Multiple  0   Live Births  0            Home Medications    Prior to Admission medications  Medication Sig Start Date End Date Taking? Authorizing Provider  Baclofen 5 MG TABS Take 5 mg by mouth in the morning, at noon, and at bedtime. 08/20/24 02/16/25 Yes [provider]  dalfampridine 10 MG TB12 Take 10 mg by mouth 2 (two) times daily. 09/10/24  Yes [provider]  modafinil (PROVIGIL) 100 MG tablet Take 2 tablets (200 mg total) by mouth every morning AND 1 tablet (100 mg total) every evening. 09/11/24 11/17/24 Yes [provider]  Cholecalciferol 50 MCG (2000 UT) TABS Take 1 tablet by mouth daily.    [provider]  DULoxetine (CYMBALTA) 30 MG capsule Take 30 mg by mouth daily.    [provider]  ELLA 30 MG tablet Take 1 tablet by mouth once. 07/14/20   [provider]   medroxyPROGESTERone (DEPO-SUBQ PROVERA 104) 104 MG/0.65ML injection Inject 104 mg into the skin See admin instructions. Inject every 12 weeks.    [provider]  Riboflavin 400 MG TABS Take 400 mg by mouth daily.    [provider]    Family History Family History  Problem Relation Age of Onset   Breast cancer Mother 59   Cancer Mother 47       brain, bone between 81 and 61   Cancer Father 84       skin    Social History Social History[1]   Allergies   Penicillins   Review of Systems Review of Systems  Gastrointestinal:  Positive for vomiting.     Physical Exam Triage Vital Signs ED Triage Vitals  Encounter Vitals Group     BP 11/03/24 1024 114/79     Girls Systolic BP Percentile --      Girls Diastolic BP Percentile --      Boys Systolic BP Percentile --      Boys Diastolic BP Percentile --      Pulse Rate 11/03/24 1024 60     Resp 11/03/24 1024 20  Temp 11/03/24 1024 98 F (36.7 C)     Temp Source 11/03/24 1024 Oral     SpO2 11/03/24 1024 97 %     Weight --      Height --      Head Circumference --      Peak Flow --      Pain Score 11/03/24 1017 3     Pain Loc --      Pain Education --      Exclude from Growth Chart --    No data found.  Updated Vital Signs BP 114/79 (BP Location: Right Arm)   Pulse 60   Temp 98 F (36.7 C) (Oral)   Resp 20   LMP 10/30/2024 (Approximate)   SpO2 97%   Visual Acuity Right Eye Distance:   Left Eye Distance:   Bilateral Distance:    Right Eye Near:   Left Eye Near:    Bilateral Near:     Physical Exam Constitutional:      Appearance: She is ill-appearing.  HENT:     Mouth/Throat:     Pharynx: No oropharyngeal exudate or posterior oropharyngeal erythema.  Pulmonary:     Effort: Pulmonary effort is normal.  Musculoskeletal:     Cervical back: Normal range of motion and neck supple.  Lymphadenopathy:     Cervical: No cervical adenopathy.  Neurological:     Mental Status: She is  alert and oriented to person, place, and time. Mental status is at baseline.      UC Treatments / Results  Labs (all labs ordered are listed, but only abnormal results are displayed) Labs Reviewed  POCT RAPID STREP A (OFFICE) - Normal  CULTURE, GROUP A STREP [facility])    EKG   Radiology No results found.  Procedures Procedures (including critical care time)  Medications Ordered in UC Medications - No data to display  Initial Impression / Assessment and Plan / UC Course  I have reviewed the triage vital signs and the nursing notes.  Pertinent labs & imaging results that were available during my care of the patient were reviewed by me and considered in my medical decision making (see chart for details).  Acute pharyngitis  Vitals are stable, while ill-appearing patient is in no signs of distress nontoxic-appearing, no erythema or or exudate noted on exam at this time and rapid strep test is negative however due to recently similar symptoms returning concerning for persisting bacterial infection therefore empirically placed on cefdinir  as well as prescribed Zofran , recommended over-the-counter medications and nonpharmacological supportive care with follow-up with urgent care as needed Final Clinical Impressions(s) / UC Diagnoses   Final diagnoses:  Sore throat   Discharge Instructions   None    ED Prescriptions   None    PDMP not reviewed this encounter.     [1]  Social History Tobacco Use   Smoking status: Never   Smokeless tobacco: Never  Vaping Use   Vaping status: Every Day  Substance Use Topics   Alcohol use: Yes    Comment: 2 times a week   Drug use: Not Currently     Teresa Shelba SAUNDERS, NP 11/03/24 1047  "

## 2024-11-03 NOTE — Discharge Instructions (Signed)
 Today you are evaluated for your sore throat and vomiting  As you recently were treated for a bacterial throat infection and symptoms that improved with antibiotics we will provide coverage for this today as well  Rapid strep test is negative for bacteria, throat swab has been sent to the lab to determine if bacteria can be isolated under the microscope, pending for 3 days and you will be notified of concerning values  Take cefdinir  twice daily for 7 days  You may use Zofran  every 8 hours as needed for nausea and vomiting  You can take Tylenol and/or Ibuprofen as needed for fever reduction and pain relief.   May try warm salt water gargles, cepacol lozenges, throat spray, warm tea or water with lemon/honey, popsicles or ice, or OTC cold relief medicine for throat discomfort.   It is important to stay hydrated: drink plenty of fluids (water, gatorade/powerade/pedialyte, juices, or teas) to keep your throat moisturized and help further relieve irritation/discomfort.

## 2024-11-06 ENCOUNTER — Ambulatory Visit (HOSPITAL_COMMUNITY): Payer: Self-pay

## 2024-11-06 LAB — CULTURE, GROUP A STREP (THRC)

## 2024-12-04 ENCOUNTER — Telehealth: Payer: Self-pay

## 2024-12-04 ENCOUNTER — Ambulatory Visit
Admission: EM | Admit: 2024-12-04 | Discharge: 2024-12-04 | Disposition: A | Discharge status: Home / Self Care | Source: Home / Self Care

## 2024-12-04 DIAGNOSIS — J0100 Acute maxillary sinusitis, unspecified: Secondary | ICD-10-CM

## 2024-12-04 HISTORY — DX: Multiple sclerosis, unspecified: G35.D

## 2024-12-04 MED ORDER — DOXYCYCLINE HYCLATE 100 MG PO CAPS: 100.0000 mg | ORAL_CAPSULE | Freq: Two times a day (BID) | ORAL | 0 refills | Status: AC

## 2024-12-04 MED ORDER — DOXYCYCLINE HYCLATE 100 MG PO CAPS: 100.0000 mg | ORAL_CAPSULE | Freq: Two times a day (BID) | ORAL | 0 refills | Status: DC

## 2024-12-04 NOTE — Telephone Encounter (Signed)
 Patient requesting to change pharmacy to [facility].

## 2024-12-04 NOTE — Discharge Instructions (Addendum)
 Take the doxycycline  as directed for your sinus infection.    Follow up with your primary care provider.
